# Patient Record
Sex: Female | Born: 2000 | Race: White | Hispanic: No | Marital: Single | Smoking: Never smoker
Health system: Southern US, Community
[De-identification: ages and names within clinical notes are randomized; demographics above are authoritative.]

## PROBLEM LIST (undated history)

## (undated) DIAGNOSIS — Z789 Other specified health status: Secondary | ICD-10-CM

## (undated) HISTORY — DX: Other specified health status: Z78.9

## (undated) HISTORY — PX: NO PAST SURGERIES: SHX2092

---
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
## 2020-08-23 ENCOUNTER — Other Ambulatory Visit: Payer: Self-pay

## 2020-08-23 ENCOUNTER — Ambulatory Visit (INDEPENDENT_AMBULATORY_CARE_PROVIDER_SITE_OTHER): Payer: BC Managed Care – PPO | Admitting: Advanced Practice Midwife

## 2020-08-23 ENCOUNTER — Encounter: Payer: Self-pay | Admitting: Advanced Practice Midwife

## 2020-08-23 ENCOUNTER — Other Ambulatory Visit (HOSPITAL_COMMUNITY)
Admission: RE | Admit: 2020-08-23 | Discharge: 2020-08-23 | Disposition: A | Discharge status: Home / Self Care | Payer: BC Managed Care – PPO | Source: Ambulatory Visit | Attending: Advanced Practice Midwife | Admitting: Advanced Practice Midwife

## 2020-08-23 VITALS — BP 114/74 | Ht 63.0 in | Wt 156.0 lb

## 2020-08-23 DIAGNOSIS — N912 Amenorrhea, unspecified: Secondary | ICD-10-CM | POA: Diagnosis not present

## 2020-08-23 DIAGNOSIS — Z3401 Encounter for supervision of normal first pregnancy, first trimester: Secondary | ICD-10-CM | POA: Diagnosis present

## 2020-08-23 DIAGNOSIS — Z3A01 Less than 8 weeks gestation of pregnancy: Secondary | ICD-10-CM

## 2020-08-23 DIAGNOSIS — Z124 Encounter for screening for malignant neoplasm of cervix: Secondary | ICD-10-CM | POA: Diagnosis present

## 2020-08-23 DIAGNOSIS — Z369 Encounter for antenatal screening, unspecified: Secondary | ICD-10-CM

## 2020-08-23 DIAGNOSIS — Z113 Encounter for screening for infections with a predominantly sexual mode of transmission: Secondary | ICD-10-CM | POA: Diagnosis present

## 2020-08-23 DIAGNOSIS — Z1159 Encounter for screening for other viral diseases: Secondary | ICD-10-CM

## 2020-08-23 LAB — POCT URINE PREGNANCY: Preg Test, Ur: POSITIVE — AB

## 2020-08-23 NOTE — Patient Instructions (Addendum)
Managing Anxiety, Adult After being diagnosed with an anxiety disorder, you may be relieved to know why you have felt or behaved a certain way. You may also feel overwhelmed about the treatment ahead and what it will mean for your life. With care and support, youcan manage this condition and recover from it. How to manage lifestyle changes Managing stress and anxiety  Stress is your body's reaction to life changes and events, both good and bad. Most stress will last just a few hours, but stress can be ongoing and can lead to more than just stress. Although stress can play a major role in anxiety, it is not the same as anxiety. Stress is usually caused by something external, such as a deadline, test, or competition. Stress normally passes after thetriggering event has ended.  Anxiety is caused by something internal, such as imagining a terrible outcome or worrying that something will go wrong that will devastate you. Anxiety often does not go away even after the triggering event is over, and it can become long-term (chronic) worry. It is important to understand the differences between stress and anxiety and to manage your stress effectively so that it does not lead to ananxious response. Talk with your health care provider or a counselor to learn more about reducing anxiety and stress. He or she may suggest tension reduction techniques, such as: Music therapy. This can include creating or listening to music that you enjoy and that inspires you. Mindfulness-based meditation. This involves being aware of your normal breaths while not trying to control your breathing. It can be done while sitting or walking. Centering prayer. This involves focusing on a word, phrase, or sacred image that means something to you and brings you peace. Deep breathing. To do this, expand your stomach and inhale slowly through your nose. Hold your breath for 3-5 seconds. Then exhale slowly, letting your stomach muscles  relax. Self-talk. This involves identifying thought patterns that lead to anxiety reactions and changing those patterns. Muscle relaxation. This involves tensing muscles and then relaxing them. Choose a tension reduction technique that suits your lifestyle and personality. These techniques take time and practice. Set aside 5-15 minutes a day to do them. Therapists can offer counseling and training in these techniques. The training to help with anxiety may be covered by some insurance plans. Other things you can do to manage stress and anxiety include: Keeping a stress/anxiety diary. This can help you learn what triggers your reaction and then learn ways to manage your response. Thinking about how you react to certain situations. You may not be able to control everything, but you can control your response. Making time for activities that help you relax and not feeling guilty about spending your time in this way. Visual imagery and yoga can help you stay calm and relax.  Medicines Medicines can help ease symptoms. Medicines for anxiety include: Anti-anxiety drugs. Antidepressants. Medicines are often used as a primary treatment for anxiety disorder. Medicines will be prescribed by a health care provider. When used together, medicines, psychotherapy, and tension reduction techniques may be the most effectivetreatment. Relationships Relationships can play a big part in helping you recover. Try to spend more time connecting with trusted friends and family members. Consider going to couples counseling, taking family education classes, or going to familytherapy. Therapy can help you and others better understand your condition. How to recognize changes in your anxiety Everyone responds differently to treatment for anxiety. Recovery from anxiety happens when symptoms decrease and stop   interfering with your daily activities at home or work. This may mean that you will start to: Have better concentration and  focus. Worry will interfere less in your daily thinking. Sleep better. Be less irritable. Have more energy. Have improved memory. It is important to recognize when your condition is getting worse. Contact your health care provider if your symptoms interfere with home or work and you feellike your condition is not improving. Follow these instructions at home: Activity Exercise. Most adults should do the following: Exercise for at least 150 minutes each week. The exercise should increase your heart rate and make you sweat (moderate-intensity exercise). Strengthening exercises at least twice a week. Get the right amount and quality of sleep. Most adults need 7-9 hours of sleep each night. Lifestyle  Eat a healthy diet that includes plenty of vegetables, fruits, whole grains, low-fat dairy products, and lean protein. Do not eat a lot of foods that are high in solid fats, added sugars, or salt. Make choices that simplify your life. Do not use any products that contain nicotine or tobacco, such as cigarettes, e-cigarettes, and chewing tobacco. If you need help quitting, ask your health care provider. Avoid caffeine, alcohol, and certain over-the-counter cold medicines. These may make you feel worse. Ask your pharmacist which medicines to avoid.  General instructions Take over-the-counter and prescription medicines only as told by your health care provider. Keep all follow-up visits as told by your health care provider. This is important. Where to find support You can get help and support from these sources: Self-help groups. Online and [employer]. A trusted spiritual leader. Couples counseling. Family education classes. Family therapy. Where to find more information You may find that joining a support group helps you deal with your anxiety. The following sources can help you locate counselors or support groups near you: Mental Health America:  www.mentalhealthamerica.net Anxiety and Depression [employer] of [place] (ADAA): ProgramCam.de [employer] Mental Illness (NAMI): www.nami.org Contact a health care provider if you: Have a hard time staying focused or finishing daily tasks. Spend many hours a day feeling worried about everyday life. Become exhausted by worry. Start to have headaches, feel tense, or have nausea. Urinate more than normal. Have diarrhea. Get help right away if you have: A racing heart and shortness of breath. Thoughts of hurting yourself or others. If you ever feel like you may hurt yourself or others, or have thoughts about taking your own life, get help right away. You can go to your nearest emergency department or call: Your local emergency services (911 in the [place] A suicide crisis helpline, such as the National Suicide Prevention Lifeline at 610-639-5456. This is open 24 hours a day. Summary Taking steps to learn and use tension reduction techniques can help calm you and help prevent triggering an anxiety reaction. When used together, medicines, psychotherapy, and tension reduction techniques may be the most effective treatment. Family, friends, and partners can play a big part in helping you recover from an anxiety disorder. This information is not intended to replace advice given to you by your health care provider. Make sure you discuss any questions you have with your healthcare provider. Document Revised: 07/14/2018 Document Reviewed: 07/14/2018 Elsevier Patient Education  2022 Else[employer] Inc. Morning Sickness  Morning sickness is when a woman feels nauseous during pregnancy. This nauseous feeling may or may not come with vomiting. It often occurs in the morning, but it can be a problem at any time of day. Morning sickness is most  common during the first trimester. In some cases, it may continue throughout pregnancy. Although morning sickness is unpleasant, it is usually harmless unless  the woman develops severe and continual vomiting (hyperemesis gravidarum), a condition that requires more intense treatment. What are the causes? The exact cause of this condition is not known, but it seems to be related tonormal hormonal changes that occur in pregnancy. What increases the risk? You are more likely to develop this condition if: You experienced nausea or vomiting before your pregnancy. You had morning sickness during a previous pregnancy. You are pregnant with more than one baby, such as twins. What are the signs or symptoms? Symptoms of this condition include: Nausea. Vomiting. How is this diagnosed? This condition is usually diagnosed based on your signs and symptoms. How is this treated? In many cases, treatment is not needed for this condition. Making some changes to what you eat may help to control symptoms. Your health care provider may also prescribe or recommend: Vitamin B6 supplements. Anti-nausea medicines. Ginger. Follow these instructions at home: Medicines Take over-the-counter and prescription medicines only as told by your health care provider. Do not use any prescription, over-the-counter, or herbal medicines for morning sickness without first talking with your health care provider. Take multivitamins before getting pregnant. This can prevent or decrease the severity of morning sickness in most women. Eating and drinking Eat a piece of dry toast or crackers before getting out of bed in the morning. Eat 5 or 6 small meals a day. Eat dry and bland foods, such as rice or a baked potato. Foods that are high in carbohydrates are often helpful. Avoid greasy, fatty, and spicy foods. Have someone cook for you if the smell of any food causes nausea and vomiting. If you feel nauseous after taking prenatal vitamins, take the vitamins at night or with a snack. Eat a protein snack between meals if you are hungry. Nuts, yogurt, and cheese are good options. Drink  fluids throughout the day. Try ginger ale made with real ginger, ginger tea made from fresh grated ginger, or ginger candies. General instructions Do not use any products that contain nicotine or tobacco. These products include cigarettes, chewing tobacco, and vaping devices, such as e-cigarettes. If you need help quitting, ask your health care provider. Get an air purifier to keep the air in your house free of odors. Get plenty of fresh air. Try to avoid odors that trigger your nausea. Consider trying these methods to help relieve symptoms: Wearing an acupressure wristband. These wristbands are often worn for seasickness. Acupuncture. Contact a health care provider if: Your home remedies are not working and you need medicine. You feel dizzy or light-headed. You are losing weight. Get help right away if: You have persistent and uncontrolled nausea and vomiting. You faint. You have severe pain in your abdomen. Summary Morning sickness is when a woman feels nauseous during pregnancy. This nauseous feeling may or may not come with vomiting. Morning sickness is most common during the first trimester. It often occurs in the morning, but it can be a problem at any time of day. In many cases, treatment is not needed for this condition. Making some changes to what you eat may help to control symptoms. This information is not intended to replace advice given to you by your health care provider. Make sure you discuss any questions you have with your healthcare provider. Document Revised: 09/27/2019 Document Reviewed: 09/06/2019 Elsevier Patient Education  2022 Elsevier Inc. Exercise During Pregnancy Exercise  is an important part of being healthy for people of all ages. Exercise improves the function of your heart and lungs and helps you maintain strength, flexibility, and a healthy body weight. Exercise also boosts energy levels andelevates mood. Most women should exercise regularly during  pregnancy. Exercise routines may need to change as your pregnancy progresses. In rare cases, women with certain medical conditions or complications may be asked to limit or avoid exercise during pregnancy. Your health care provider will give you information on whatwill work for you. How does this affect me? Along with maintaining general strength and flexibility, exercising during pregnancy can help: Keep strength in muscles that are used during labor and childbirth. Decrease low back pain or symptoms of depression. Control weight gain during pregnancy. Reduce the risk of needing insulin if you develop diabetes during pregnancy. Decrease the risk of cesarean delivery. Speed up your recovery after giving birth. Relieve constipation. How does this affect my baby? Exercise can help you have a healthy pregnancy. Exercise does not cause early (premature) birth. It will not cause your baby to weigh less at birth. What exercises can I do? Many exercises are safe for you to do during pregnancy. Do a variety of exercises that safely increase your heart and breathing rates and help you build and maintain muscle strength. Do exercises exactly as told by your health care provider. You may do these exercises: Walking. Swimming. Water aerobics. Riding a stationary bike. Modified yoga or Pilates. Tell your instructor that you are pregnant. Avoid overstretching, and avoid lying on your back for long periods of time. Running or jogging. Choose this type of exercise only if: You ran or jogged regularly before your pregnancy. You can run or jog and still talk in complete sentences. What exercises should I avoid? You may be told to limit high-intensity exercise depending on your level of fitness and whether you exercised regularly before you were pregnant. You can tell that you are exercising at a high intensity if you are breathing much harder and faster and cannot hold a conversation while exercising. You must  avoid: Contact sports. Activities that put you at risk for falling on or being hit in the belly, such as downhill skiing, waterskiing, surfing, rock climbing, cycling, gymnastics, and horseback riding. Scuba diving. Skydiving. Hot yoga or hot Pilates. These activities take place in a room that is heated to high temperatures. Jogging or running, unless you jogged or ran regularly before you were pregnant. While jogging or running, you should always be able to talk in full sentences. Do not run or jog so fast that you are unable to have a conversation. Do not exercise at more than 6,000 feet above sea level (high elevation) if you are not used to exercising at high elevation. How do I exercise in a safe way?  Avoid overheating. Do not exercise in very high temperatures. Wear loose-fitting, breathable clothes. Avoid dehydration. Drink enough fluid before, during, and after exercise to keep your urine pale yellow. Avoid overstretching. Because of hormone changes during pregnancy, it is easy to overstretch muscles, tendons, and ligaments. Start slowly and ask your health care provider to recommend the types of exercise that are safe for you. Do not exercise to lose weight. Wear a sports bra to support your breasts. Avoid standing still or lying flat on your back as much as you can. Follow these instructions at home: Exercise on most days or all days of the week. Try to exercise for 30 minutes a  day, 5 days a week, unless your health care provider tells you not to. If you actively exercised before your pregnancy and you are healthy, your health care provider may tell you to continue to do moderate-intensity to high-intensity exercise. If you are just starting to exercise or did not exercise much before your pregnancy, your health care provider may tell you to do low-intensity to moderate-intensity exercise. Questions to ask your health care provider Is exercise safe for me? What are signs that I  should stop exercising? Does my health condition mean that I should not exercise during pregnancy? When should I avoid exercising during pregnancy? Stop exercising and contact a health care provider if: You have any unusual symptoms such as: Mild contractions of the uterus or cramps in the abdomen. A dizzy feeling that does not go away when you rest. Stop exercising and get help right away if: You have any unusual symptoms such as: Sudden, severe pain in your low back or your belly. Regular, painful contractions of your uterus. Chest pain. Bleeding or fluid leaking from your vagina. Shortness of breath. Headache. Pain and swelling of your calves. Summary Most women should exercise regularly throughout pregnancy. In rare cases, women with certain medical conditions or complications may be asked to limit or avoid exercise during pregnancy. Do not exercise to lose weight during pregnancy. Your health care provider will tell you what level of physical activity is right for you. Stop exercising and contact a health care provider if you have unusual symptoms, such as mild contractions or dizziness. This information is not intended to replace advice given to you by your health care provider. Make sure you discuss any questions you have with your healthcare provider. Document Revised: 09/29/2019 Document Reviewed: 09/29/2019 Elsevier Patient Education  2022 Elsevier Inc. Eating Plan for Pregnant Women While you are pregnant, your body requires additional nutrition to help support your growing baby. You also have a higher need for some vitamins and minerals, such as folic acid, calcium, iron, and vitamin D. Eating a healthy, well-balanced diet is very important for your health and your baby's health. Your need for extra calories varies over the course of your pregnancy. Pregnancy is divided into three trimesters, with each trimester lasting 3 months. For most women, it is recommended to  consume: 150 extra calories a day during the first trimester. 300 extra calories a day during the second trimester. 300 extra calories a day during the third trimester. What are tips for following this plan? Cooking Practice good food safety and cleanliness. Wash your hands before you eat and after you prepare raw meat. Wash all fruits and vegetables well before peeling or eating. Taking these actions can help to prevent foodborne illnesses that can be very dangerous to your baby, such as listeriosis. Ask your health care provider for more information about listeriosis. Make sure that all meats, poultry, and eggs are cooked to food-safe temperatures or "well-done." Meal planning  Eat a variety of foods (especially fruits and vegetables) to get a full range of vitamins and minerals. Two or more servings of fish are recommended each week in order to get the most benefits from omega-3 fatty acids that are found in seafood. Choose fish that are lower in mercury, such as salmon and pollock. Limit your overall intake of foods that have "empty calories." These are foods that have little nutritional value, such as sweets, desserts, candies, and sugar-sweetened beverages. Drinks that contain caffeine are okay to drink, but it is better  to avoid caffeine. Keep your total caffeine intake to less than 200 mg each day (which is 12 oz or 355 mL of coffee, tea, or soda) or the limit as told by your health care provider.  General information Do not try to lose weight or go on a diet during pregnancy. Take a prenatal vitamin to help meet your additional vitamin and mineral needs during pregnancy, specifically for folic acid, iron, calcium, and vitamin D. Remember to stay active. Ask your health care provider what types of exercise and activities are safe for you. What does 150 extra calories look like? Healthy options that provide 150 extra calories each day could be any of the following: 6-8 oz (170-227 g)  plain low-fat yogurt with  cup (70 g) berries. 1 apple with 2 tsp (11 g) peanut butter. Cut-up vegetables with  cup (60 g) hummus. 8 fl oz (237 mL) low-fat chocolate milk. 1 stick of string cheese with 1 medium orange. 1 peanut butter and jelly sandwich that is made with one slice of whole-wheat bread and 1 tsp (5 g) of peanut butter. For 300 extra calories, you could eat two of these healthy options each day. What is a healthy amount of weight to gain? The right amount of weight gain for you is based on your BMI (body mass index) before you became pregnant. If your BMI was less than 18 (underweight), you should gain 28-40 lb (13-18 kg). If your BMI was 18-24.9 (normal), you should gain 25-35 lb (11-16 kg). If your BMI was 25-29.9 (overweight), you should gain 15-25 lb (7-11 kg). If your BMI was 30 or greater (obese), you should gain 11-20 lb (5-9 kg). What if I am having twins or multiples? Generally, if you are carrying twins or multiples: You may need to eat 300-600 extra calories a day. The recommended range for total weight gain is 25-54 lb (11-25 kg), depending on your BMI before pregnancy. Talk with your health care provider to find out about nutritional needs, weight gain, and exercise that is right for you. What foods should I eat?  Fruits All fruits. Eat a variety of colors and types of fruit. Remember to wash yourfruits well before peeling or eating. Vegetables All vegetables. Eat a variety of colors and types of vegetables. Remember towash your vegetables well before peeling or eating. Grains All grains. Choose whole grains, such as whole-wheat bread, oatmeal, or brownrice. Meats and other protein foods Lean meats, including chicken, [place], and lean cuts of beef, veal, or pork. Fish that is higher in omega-3 fatty acids and lower in mercury, such as salmon, herring, mussels, trout, sardines, pollock, shrimp, crab, and lobster.Tofu. Tempeh. Beans. Eggs. Peanut butter and  other nut butters. Dairy Pasteurized milk and milk alternatives, such as almond milk. Pasteurized yogurtand pasteurized cheese. Cottage cheese. Sour cream. Beverages Water. Juices that contain 100% fruit juice or vegetable juice. Caffeine-freeteas and decaffeinated coffee. Fats and oils Fats and oils are okay to include in moderation. Sweets and desserts Sweets and desserts are okay to include in moderation. Seasoning and other foods All pasteurized condiments. The items listed above may not be a complete list of foods and beverages you can eat. Contact a dietitian for more information. What foods should I avoid? Fruits Raw (unpasteurized) fruit juices. Vegetables Unpasteurized vegetable juices. Meats and other protein foods Precooked or cured meat, such as bologna, hot dogs, sausages, or meat loaves. (If you must eat those meats, reheat them until they are steaming hot.) Refrigerated pate,  meat spreads from a meat counter, or smoked seafood that is found in the refrigerated section of a store. Raw or undercooked meats, poultry, and eggs. Raw fish, such as sushi or sashimi. Fish that have highmercury content, such as tilefish, shark, swordfish, and king mackerel. Dairy Unpasteurized milk and any foods that have unpasteurized milk in them. Soft cheeses, such as feta, queso blanco, queso fresco, [place], Camembert, panela, and blue-veined cheeses (unless they are made with pasteurized milk, which must bestated on the label). Beverages Alcohol. Sugar-sweetened beverages, such as sodas, teas, or energy drinks. Seasoning and other foods Homemade fermented foods and drinks, such as pickles, sauerkraut, or kombuchadrinks. (Store-bought pasteurized versions of these are okay.) Salads that are made in a store or deli, such as ham salad, chicken salad, eggsalad, tuna salad, and seafood salad. The items listed above may not be a complete list of foods and beverages you should avoid. Contact a dietitian  for more information. Where to find more information To calculate the number of calories you need based on your height, weight, and activity level, you can use an online calculator such as: PayStrike.dkwww.myplate.gov/myplate-plan To calculate how much weight you should gain during pregnancy, you can use an online pregnancy weight gain calculator such as: http://www.harvey.com/www.myplate.gov To learn more about eating fish during pregnancy, talk with your health care provider or visit: PumpkinSearch.com.eewww.fda.gov Summary While you are pregnant, your body requires additional nutrition to help support your growing baby. Eat a variety of foods, especially fruits and vegetables, to get a full range of vitamins and minerals. Practice good food safety and cleanliness. Wash your hands before you eat and after you prepare raw meat. Wash all fruits and vegetables well before peeling or eating. Taking these actions can help to prevent foodborne illnesses, such as listeriosis, that can be very dangerous to your baby. Do not eat raw meat or fish. Do not eat fish that have high mercury content, such as tilefish, shark, swordfish, and king mackerel. Do not eat raw (unpasteurized) dairy. Take a prenatal vitamin to help meet your additional vitamin and mineral needs during pregnancy, specifically for folic acid, iron, calcium, and vitamin D. This information is not intended to replace advice given to you by your health care provider. Make sure you discuss any questions you have with your healthcare provider. Document Revised: 09/09/2019 Document Reviewed: 09/09/2019 Elsevier Patient Education  2022 Elsevier Inc. https://www.acog.org/womens-health/faqs/prenatal-genetic-screening-tests">  Prenatal Care Prenatal care is health care during pregnancy. It helps you and your unborn baby (fetus) stay as healthy as possible. Prenatal care may be provided by a midwife, a family practice doctor, a Dispensing opticianmid-level practitioner (nurse practitioner or physician assistant), or a  childbirth and pregnancy doctor (obstetrician). How does this affect me? During pregnancy, you will be closely monitored for any new conditions that might develop. To lower your risk of pregnancy complications, you and yourhealth care provider will talk about any underlying conditions you have. How does this affect my baby? Early and consistent prenatal care increases the chance that your baby will be healthy during pregnancy. Prenatal care lowers the risk that your baby will be: Born early (prematurely). Smaller than expected at birth (small for gestational age). What can I expect at the first prenatal care visit? Your first prenatal care visit will likely be the longest. You should schedule your first prenatal care visit as soon as you know that you are pregnant. Your first visit is a good time to talk about any questions or concerns you haveabout pregnancy. Medical  history At your visit, you and your health care provider will talk about your medical history, including: Any past pregnancies. Your family's medical history. Medical history of the baby's father. Any long-term (chronic) health conditions you have and how you manage them. Any surgeries or procedures you have had. Any current over-the-counter or prescription medicines, herbs, or supplements that you are taking. Other factors that could pose a risk to your baby, including: Exposure to harmful chemicals or radiation at work or at home. Any substance use, including tobacco, alcohol, and drug use. Your home setting and your stress levels, including: Exposure to abuse or violence. Household financial strain. Your daily health habits, including diet and exercise. Tests and screenings Your health care provider will: Measure your weight, height, and blood pressure. Do a physical exam, including a pelvic and breast exam. Perform blood tests and urine tests to check for: Urinary tract infection. Sexually transmitted infections  (STIs). Low iron levels in your blood (anemia). Blood type and certain proteins on red blood cells (Rh antibodies). Infections and immunity to viruses, such as hepatitis B and rubella. HIV (human immunodeficiency virus). Discuss your options for genetic screening. Tips about staying healthy Your health care provider will also give you information about how to keep yourself and your baby healthy, including: Nutrition and taking vitamins. Physical activity. How to manage pregnancy symptoms such as nausea and vomiting (morning sickness). Infections and substances that may be harmful to your baby and how to avoid them. Food safety. Dental care. Working. Travel. Warning signs to watch for and when to call your health care provider. How often will I have prenatal care visits? After your first prenatal care visit, you will have regular visits throughout your pregnancy. The visit schedule is often as follows: Up to week 28 of pregnancy: once every 4 weeks. 28-36 weeks: once every 2 weeks. After 36 weeks: every week until delivery. Some women may have visits more or less often depending on any underlyinghealth conditions and the health of the baby. Keep all follow-up and prenatal care visits. This is important. What happens during routine prenatal care visits? Your health care provider will: Measure your weight and blood pressure. Check for fetal heart sounds. Measure the height of your uterus in your abdomen (fundal height). This may be measured starting around week 20 of pregnancy. Check the position of your baby inside your uterus. Ask questions about your diet, sleeping patterns, and whether you can feel the baby move. Review warning signs to watch for and signs of labor. Ask about any pregnancy symptoms you are having and how you are dealing with them. Symptoms may include: Headaches. Nausea and vomiting. Vaginal discharge. Swelling. Fatigue. Constipation. Changes in your  vision. Feeling persistently sad or anxious. Any discomfort, including back or pelvic pain. Bleeding or spotting. Make a list of questions to ask your health care provider at your routinevisits. What tests might I have during prenatal care visits? You may have blood, urine, and imaging tests throughout your pregnancy, such as: Urine tests to check for glucose, protein, or signs of infection. Glucose tests to check for a form of diabetes that can develop during pregnancy (gestational diabetes mellitus). This is usually done around week 24 of pregnancy. Ultrasounds to check your baby's growth and development, to check for birth defects, and to check your baby's well-being. These can also help to decide when you should deliver your baby. A test to check for group B strep (GBS) infection. This is usually done around week  36 of pregnancy. Genetic testing. This may include blood, fluid, or tissue sampling, or imaging tests, such as an ultrasound. Some genetic tests are done during the first trimester and some are done during the second trimester. What else can I expect during prenatal care visits? Your health care provider may recommend getting certain vaccines during pregnancy. These may include: A yearly flu shot (annual influenza vaccine). This is especially important if you will be pregnant during flu season. Tdap (tetanus, diphtheria, pertussis) vaccine. Getting this vaccine during pregnancy can protect your baby from whooping cough (pertussis) after birth. This vaccine may be recommended between weeks 27 and 36 of pregnancy. A COVID-19 vaccine. Later in your pregnancy, your health care provider may give you information about: Childbirth and breastfeeding classes. Choosing a health care provider for your baby. Umbilical cord banking. Breastfeeding. Birth control after your baby is born. The hospital labor and delivery unit and how to set up a tour. Registering at the hospital before you go  into labor. Where to find more information Office on Women's Health: TravelLesson.ca American Pregnancy Association: americanpregnancy.org March of Dimes: marchofdimes.org Summary Prenatal care helps you and your baby stay as healthy as possible during pregnancy. Your first prenatal care visit will most likely be the longest. You will have visits and tests throughout your pregnancy to monitor your health and your baby's health. Bring a list of questions to your visits to ask your health care provider. Make sure to keep all follow-up and prenatal care visits. This information is not intended to replace advice given to you by your health care provider. Make sure you discuss any questions you have with your healthcare provider. Document Revised: 11/25/2019 Document Reviewed: 11/25/2019 Elsevier Patient Education  2022 [employer].

## 2020-08-23 NOTE — Progress Notes (Signed)
NOB- no concerns ?

## 2020-08-24 ENCOUNTER — Encounter: Payer: Self-pay | Admitting: Advanced Practice Midwife

## 2020-08-24 DIAGNOSIS — Z3401 Encounter for supervision of normal first pregnancy, first trimester: Secondary | ICD-10-CM | POA: Insufficient documentation

## 2020-08-24 NOTE — Progress Notes (Signed)
New Obstetric Patient H&P  Date of Service: 08/23/2020  Chief Complaint: "Desires prenatal care"   History of Present Illness: Patient is a 20 y.o. G1P0 Not Hispanic or Latino female, presents with amenorrhea and positive home pregnancy test. Patient's last menstrual period was 07/05/2020 (approximate). and based on her  LMP, her EDD is Estimated Date of Delivery: 04/11/21 and her EGA is [redacted]w[redacted]d. Cycles are 5 days, regular, and occur approximately every : 28 days. Her first PAP smear is today.   She had a urine pregnancy test which was positive 2 week(s)  ago. Her last menstrual period was normal and lasted for  5 day(s). Since her LMP she claims she has experienced breast tenderness, fatigue, nausea, some vomiting. She denies vaginal bleeding. Her past medical history is noncontributory. This is her first pregnancy. Pregnancy is unplanned.  Since her LMP, she admits to the use of tobacco products:  she quit with +UPT She claims she has gained a few pounds since the start of her pregnancy.  There are cats in the home in the home  no  She admits close contact with children on a regular basis  no  She has had chicken pox in the past no She has had Tuberculosis exposures, symptoms, or previously tested positive for TB   no Current or past history of domestic violence. no  Genetic Screening/Teratology Counseling: (Includes patient, baby's father, or anyone in either family with:)   1. Patient's age >/= 73 at [facility]  no 2. Thalassemia ([place], [place], Mediterranean, or Asian background): MCV<80  no 3. Neural tube defect (meningomyelocele, spina bifida, anencephaly)  no 4. Congenital heart defect  no  5. Down syndrome  no 6. Tay-Sachs (Jewish, [place]  no 7. Canavan's Disease  no 8. Sickle cell disease or trait (African)  no  9. Hemophilia or other blood disorders  no  10. Muscular dystrophy  no  11. Cystic fibrosis  no  12. Huntington's Chorea  no  13. Mental retardation/autism   no 14. Other inherited genetic or chromosomal disorder  no 15. Maternal metabolic disorder (DM, PKU, etc)  no 16. Patient or FOB with a child with a birth defect not listed above no  16a. Patient or FOB with a birth defect themselves no 17. Recurrent pregnancy loss, or stillbirth  no  18. Any medications since LMP other than prenatal vitamins (include vitamins, supplements, OTC meds, drugs, alcohol)  no 19. Any other genetic/environmental exposure to discuss  no  Infection History:   1. Lives with someone with TB or TB exposed  no  2. Patient or partner has history of genital herpes  no 3. Rash or viral illness since LMP  no 4. History of STI (GC, CT, HPV, syphilis, HIV)  no 5. History of recent travel :  no  Other pertinent information:  she is planning to move to [place] at the end of this year    Review of Systems:10 point review of systems negative unless otherwise noted in HPI  Past Medical History:  Patient Active Problem List   Diagnosis Date Noted   Encounter for supervision of normal first pregnancy in first trimester 08/24/2020     Nursing Staff Provider  Office Location  [facility]    Language  English Anatomy US    Flu Vaccine   Genetic Screen  NIPS:   TDaP vaccine    Hgb A1C or  GTT Early : Third trimester :   Covid    LAB RESULTS  Rhogam   Blood Type     Feeding Plan Uncertain: BF ed. reviewed @ [facility] Antibody    Contraception  Rubella    Circumcision  RPR     Pediatrician   HBsAg     Support Person Husband Casimiro Needle HIV    Prenatal Classes  Varicella     GBS  (For PCN allergy, check sensitivities)   BTL Consent     VBAC Consent  Pap      Hgb Electro    Pelvis Tested  CF      SMA             Past Surgical History:  Past Surgical History:  Procedure Laterality Date   NO PAST SURGERIES      Gynecologic History: Patient's last menstrual period was 07/05/2020 (approximate).  Obstetric History: G1P0  Family History:  Family History  Problem  Relation Age of Onset   Diabetes Father     Social History:  Social History   Socioeconomic History   Marital status: Single    Spouse name: Not on file   Number of children: Not on file   Years of education: Not on file   Highest education level: Not on file  Occupational History   Not on file  Tobacco Use   Smoking status: Never   Smokeless tobacco: Never  Vaping Use   Vaping Use: Former  Substance and Sexual Activity   Alcohol use: Not Currently   Drug use: Never   Sexual activity: Yes    Partners: Male    Birth control/protection: None  Other Topics Concern   Not on file  Social History Narrative   Not on file   Social Determinants of Health   Financial Resource Strain: Not on file  Food Insecurity: Not on file  Transportation Needs: Not on file  Physical Activity: Not on file  Stress: Not on file  Social Connections: Not on file  Intimate Partner Violence: Not on file    Allergies:  Allergies  Allergen Reactions   Penicillin G Hives    Medications: Prior to Admission medications   Not on File    Physical Exam Vitals: Blood pressure 114/74, height 5\' 3"  (1.6 m), weight 156 lb (70.8 kg), last menstrual period 07/05/2020.  General: NAD HEENT: normocephalic, anicteric Thyroid: no enlargement, no palpable nodules Pulmonary: No increased work of breathing, CTAB Cardiovascular: RRR, distal pulses 2+ Abdomen: NABS, soft, non-tender, non-distended.  Umbilicus without lesions.  No hepatomegaly, splenomegaly or masses palpable. No evidence of hernia  Genitourinary:  External: Normal external female genitalia.  Normal urethral meatus, normal Bartholin's and Skene's glands.    Vagina: Normal vaginal mucosa, no evidence of prolapse.    Cervix: Grossly normal in appearance, no bleeding, no CMT  Uterus:  Non-enlarged, mobile, normal contour.    Adnexa: ovaries non-enlarged, no adnexal masses  Rectal: deferred Extremities: no edema, erythema, or  tenderness Neurologic: Grossly intact Psychiatric: mood appropriate, affect full   The following were addressed during this visit:  Breastfeeding Education - Early initiation of breastfeeding    Comments: Keeps milk supply adequate, helps contract uterus and slow bleeding, and early milk is the perfect first food and is easy to digest.   - The importance of exclusive breastfeeding    Comments: Provides antibodies, Lower risk of breast and ovarian cancers, and type-2 diabetes,Helps your body recover, Reduced chance of SIDS.   - Risks of giving your baby anything other than breast milk if you are breastfeeding  Comments: Make the baby less content with breastfeeds, may make my baby more susceptible to illness, and may reduce my milk supply.   - The importance of early skin-to-skin contact    Comments:  Keeps baby warm and secure, helps keep baby's blood sugar up and breathing steady, easier to bond and breastfeed, and helps calm baby.  - Rooming-in on a 24-hour basis    Comments: Easier to learn baby's feeding cues, easier to bond and get to know each other, and encourages milk production.   - Feeding on demand or baby-led feeding    Comments: Helps prevent breastfeeding complications, helps bring in good milk supply, prevents under or overfeeding, and helps baby feel content and satisfied   - Frequent feeding to help assure optimal milk production    Comments: Making a full supply of milk requires frequent removal of milk from breasts, infant will eat 8-12 times in 24 hours, if separated from infant use breast massage, hand expression and/ or pumping to remove milk from breasts.   - Effective positioning and attachment    Comments: Helps my baby to get enough breast milk, helps to produce an adequate milk supply, and helps prevent nipple pain and damage   - Exclusive breastfeeding for the first 6 months    Comments: Builds a healthy milk supply and keeps it up, protects baby  from sickness and disease, and breastmilk has everything your baby needs for the first 6 months.   - Individualized Education    Comments: Contraindications to breastfeeding and other special medical conditions Patient is uncertain if she will breastfeed. She accepts breastfeeding education.    Assessment: 20 y.o. G1P0 at [redacted]w[redacted]d by approximate LMP presenting to initiate prenatal care  Plan: 1) Avoid alcoholic beverages. 2) Patient encouraged not to smoke.  3) Discontinue the use of all non-medicinal drugs and chemicals.  4) Take prenatal vitamins daily.  5) Nutrition, food safety (fish, cheese advisories, and high nitrite foods) and exercise discussed. 6) Hospital and practice style discussed with cross coverage system.  7) Genetic Screening, such as with 1st Trimester Screening, cell free fetal DNA, AFP testing, and Ultrasound, as well as with amniocentesis and CVS as appropriate, is discussed with patient. At the conclusion of today's visit patient undecided genetic testing 8) Patient is asked about travel to areas at risk for the [place] virus, and counseled to avoid travel and exposure to mosquitoes or sexual partners who may have themselves been exposed to the virus. Testing is discussed, and will be ordered as appropriate.  9) PAPtima, urine culture today 10) Return to clinic in 1 week for dating scan, NOB panel, Hep C screen, ROB   Tresea Mall, CNM [facility] 08/24/2020, 10:55 AM

## 2020-08-25 ENCOUNTER — Telehealth: Payer: Self-pay

## 2020-08-25 NOTE — Telephone Encounter (Signed)
Pt calling; was seen earlier this week; wants to speak c nurse about her preg.  [phone]  Pt states she is feeling sick; has back problems; after a lot of thought she does not want to proceed with this preg; what to do?  Adv she can contact either [facility], [facility], or [facility].  Pt requested her future appt be cancelled.  Adv I will cancel it.

## 2020-08-26 LAB — URINE CULTURE

## 2020-08-29 LAB — CYTOLOGY - PAP
Chlamydia: NEGATIVE
Comment: NEGATIVE
Comment: NEGATIVE
Comment: NORMAL
Diagnosis: NEGATIVE
Neisseria Gonorrhea: NEGATIVE
Trichomonas: NEGATIVE

## 2020-08-31 ENCOUNTER — Encounter: Payer: BC Managed Care – PPO | Admitting: Obstetrics and Gynecology
